# Patient Record
Sex: Female | Born: 1965 | Race: Black or African American | Hispanic: No | Marital: Single | Smoking: Current every day smoker
Health system: Southern US, Community
[De-identification: ages and names within clinical notes are randomized; demographics above are authoritative.]

---

## 2011-12-16 ENCOUNTER — Ambulatory Visit: Payer: Self-pay | Admitting: Family

## 2011-12-23 ENCOUNTER — Ambulatory Visit: Payer: Self-pay | Admitting: Family

## 2012-01-05 ENCOUNTER — Ambulatory Visit (INDEPENDENT_AMBULATORY_CARE_PROVIDER_SITE_OTHER): Payer: BC Managed Care – PPO | Admitting: Family

## 2012-01-05 ENCOUNTER — Encounter: Payer: Self-pay | Admitting: Family

## 2012-01-05 VITALS — BP 120/80 | HR 100 | Ht 65.5 in | Wt 184.0 lb

## 2012-01-05 DIAGNOSIS — E669 Obesity, unspecified: Secondary | ICD-10-CM

## 2012-01-05 DIAGNOSIS — Z23 Encounter for immunization: Secondary | ICD-10-CM

## 2012-01-05 MED ORDER — PHENTERMINE HCL 37.5 MG PO CAPS: 37.5000 mg | ORAL_CAPSULE | ORAL | Status: DC

## 2012-01-05 NOTE — Patient Instructions (Signed)

## 2012-01-05 NOTE — Progress Notes (Signed)
  Subjective:    Patient ID: Angelica Yoder, female    DOB: 1965/08/01, 46 y.o.   MRN: [id number]  HPI 46 year  Old black female, nonsmoker, is in today to be established. She is transferring from [facility] in [place]. She has concerns of obesity. She has not been exercising. She has been attempting to watch her diet. She has been on Phentermine in the past that has helped. She has had labs performed in June that were all negative.    Review of Systems  Constitutional: Positive for unexpected weight change. Negative for diaphoresis, appetite change and fatigue.  Respiratory: Negative.   Cardiovascular: Negative.   Gastrointestinal: Negative.   Musculoskeletal: Negative.   Skin: Negative.   Neurological: Negative.   Hematological: Negative.   Psychiatric/Behavioral: Negative.    No past medical history on file.  History   Social History  . Marital Status: Single    Spouse Name: N/A    Number of Children: N/A  . Years of Education: N/A   Occupational History  . Not on file.   Social History Main Topics  . Smoking status: Current Every Day Smoker  . Smokeless tobacco: Not on file  . Alcohol Use: Yes     wine occassionally  . Drug Use: No  . Sexually Active: Yes -- Female partner(s)   Other Topics Concern  . Not on file   Social History Narrative  . No narrative on file    No past surgical history on file.  Family History  Problem Relation Age of Onset  . Alcohol abuse Mother   . Hypertension Mother   . Hypertension Father   . Diabetes Paternal Grandmother     Allergies  Allergen Reactions  . Rocephin (Ceftriaxone)     Current Outpatient Prescriptions on File Prior to Visit  Medication Sig Dispense Refill  . phentermine 37.5 MG capsule Take 1 capsule (37.5 mg total) by mouth every morning.  30 capsule  1    BP 120/80  Pulse 100  Ht 5' 5.5" (1.664 m)  Wt 184 lb (83.462 kg)  BMI 30.15 kg/m2  SpO2 98%  LMP 09/30/2013chart      Objective:   Physical Exam  Constitutional: She is oriented to person, place, and time. She appears well-developed and well-nourished.  Neck: Normal range of motion. Neck supple. No thyromegaly present.  Cardiovascular: Normal rate, regular rhythm and normal heart sounds.   Pulmonary/Chest: Effort normal and breath sounds normal.  Abdominal: Soft. Bowel sounds are normal.  Musculoskeletal: Normal range of motion.  Neurological: She is alert and oriented to person, place, and time.  Skin: Skin is warm and dry.  Psychiatric: She has a normal mood and affect.          Assessment & Plan:  Assessment: Obesity, Weight Gain  Plan: Start Phentermine 37.5mg  once a day. Recheck 1 month and sooner as needed. Schedule mammogram. Request records from [facility].

## 2012-03-07 ENCOUNTER — Ambulatory Visit: Payer: BC Managed Care – PPO | Admitting: Family

## 2012-03-08 ENCOUNTER — Ambulatory Visit (INDEPENDENT_AMBULATORY_CARE_PROVIDER_SITE_OTHER): Payer: BC Managed Care – PPO | Admitting: Family

## 2012-03-08 ENCOUNTER — Encounter: Payer: Self-pay | Admitting: Family

## 2012-03-08 VITALS — BP 120/80 | Temp 98.2°F | Wt 183.0 lb

## 2012-03-08 DIAGNOSIS — Z720 Tobacco use: Secondary | ICD-10-CM

## 2012-03-08 DIAGNOSIS — F17200 Nicotine dependence, unspecified, uncomplicated: Secondary | ICD-10-CM

## 2012-03-08 DIAGNOSIS — E669 Obesity, unspecified: Secondary | ICD-10-CM

## 2012-03-08 MED ORDER — VARENICLINE TARTRATE 0.5 MG X 11 & 1 MG X 42 PO MISC: ORAL | Status: DC

## 2012-03-08 MED ORDER — PHENTERMINE HCL 37.5 MG PO CAPS: 37.5000 mg | ORAL_CAPSULE | ORAL | Status: DC

## 2012-03-08 NOTE — Progress Notes (Signed)
  Subjective:    Patient ID: Angelica Yoder, female    DOB: [birth year], 46 y.o.   MRN: [id number]  HPI 46 year old [place] American female, smoker is in today for recheck of obesity. 2 months ago she was in phentermine 37.5 mg once daily. She is down 1 pound today. However, reports to her closer feeling much better. She's not currently exercising. However she works at [employer]. Continues to drink sodas, but has increased her intake of juice and water. Patient has been under an increased amount of stress due to the death of her mother. She is also less sexually active. Currently smokes 4-5 cigarettes a day.   Review of Systems  Constitutional: Negative.   Respiratory: Negative.   Cardiovascular: Negative.   Gastrointestinal: Negative.   Musculoskeletal: Negative.   Skin: Negative.   Neurological: Negative.   Hematological: Negative.   Psychiatric/Behavioral: Negative.    No past medical history on file.  History   Social History  . Marital Status: Single    Spouse Name: N/A    Number of Children: N/A  . Years of Education: N/A   Occupational History  . Not on file.   Social History Main Topics  . Smoking status: Current Every Day Smoker  . Smokeless tobacco: Not on file  . Alcohol Use: Yes     Comment: wine occassionally  . Drug Use: No  . Sexually Active: Yes -- Female partner(s)   Other Topics Concern  . Not on file   Social History Narrative  . No narrative on file    No past surgical history on file.  Family History  Problem Relation Age of Onset  . Alcohol abuse Mother   . Hypertension Mother   . Hypertension Father   . Diabetes Paternal Grandmother     Allergies  Allergen Reactions  . Rocephin (Ceftriaxone)     Current Outpatient Prescriptions on File Prior to Visit  Medication Sig Dispense Refill  . [DISCONTINUED] phentermine 37.5 MG capsule Take 1 capsule (37.5 mg total) by mouth every morning.  30 capsule  1    BP 120/80  Temp 98.2 F (36.8 C)  (Oral)  Wt 183 lb (83.008 kg)chart    Objective:   Physical Exam  Constitutional: She is oriented to person, place, and time. She appears well-developed and well-nourished.  Neck: Normal range of motion. Neck supple. No thyromegaly present.  Cardiovascular: Normal rate, regular rhythm and normal heart sounds.   Pulmonary/Chest: Effort normal and breath sounds normal.  Abdominal: Soft. Bowel sounds are normal.  Musculoskeletal: Normal range of motion.  Neurological: She is alert and oriented to person, place, and time.  Skin: Skin is warm and dry.  Psychiatric: She has a normal mood and affect.          Assessment & Plan:  Assessment: Obesity, Tobacco Abuse  Plan: Refilled Phentermine x 1 month. If she does not lose weight, we will discontinue med. Exercise 30 min most days a week. Start Chantix daily. Call the office if symptoms worsen or persist. Recheck as scheduled and sooner as needed.

## 2012-03-08 NOTE — Patient Instructions (Signed)

## 2012-04-14 ENCOUNTER — Ambulatory Visit: Payer: BC Managed Care – PPO | Admitting: Family

## 2012-04-15 ENCOUNTER — Encounter: Payer: Self-pay | Admitting: Family

## 2012-04-15 ENCOUNTER — Other Ambulatory Visit (HOSPITAL_COMMUNITY)
Admission: RE | Admit: 2012-04-15 | Discharge: 2012-04-15 | Disposition: A | Discharge status: Home / Self Care | Payer: BC Managed Care – PPO | Source: Ambulatory Visit | Attending: Family | Admitting: Family

## 2012-04-15 ENCOUNTER — Ambulatory Visit (INDEPENDENT_AMBULATORY_CARE_PROVIDER_SITE_OTHER): Payer: BC Managed Care – PPO | Admitting: Family

## 2012-04-15 VITALS — BP 118/78 | HR 100 | Temp 98.2°F | Wt 179.0 lb

## 2012-04-15 DIAGNOSIS — J309 Allergic rhinitis, unspecified: Secondary | ICD-10-CM

## 2012-04-15 DIAGNOSIS — N898 Other specified noninflammatory disorders of vagina: Secondary | ICD-10-CM

## 2012-04-15 DIAGNOSIS — E669 Obesity, unspecified: Secondary | ICD-10-CM

## 2012-04-15 DIAGNOSIS — N760 Acute vaginitis: Secondary | ICD-10-CM | POA: Insufficient documentation

## 2012-04-15 DIAGNOSIS — Z79899 Other long term (current) drug therapy: Secondary | ICD-10-CM

## 2012-04-15 MED ORDER — FLUTICASONE PROPIONATE 50 MCG/ACT NA SUSP: 2.0000 | Freq: Every day | NASAL | Status: DC

## 2012-04-15 MED ORDER — DOXYCYCLINE HYCLATE 100 MG PO TABS: 100.0000 mg | ORAL_TABLET | Freq: Two times a day (BID) | ORAL | Status: DC

## 2012-04-15 MED ORDER — PHENTERMINE HCL 37.5 MG PO CAPS: 37.5000 mg | ORAL_CAPSULE | ORAL | Status: DC

## 2012-04-15 NOTE — Patient Instructions (Signed)
Allergic Rhinitis Allergic rhinitis is when the mucous membranes in the nose respond to allergens. Allergens are particles in the air that cause your body to have an allergic reaction. This causes you to release allergic antibodies. Through a chain of events, these eventually cause you to release histamine into the blood stream (hence the use of antihistamines). Although meant to be protective to the body, it is this release that causes your discomfort, such as frequent sneezing, congestion and an itchy runny nose.  CAUSES  The pollen allergens may come from grasses, trees, and weeds. This is seasonal allergic rhinitis, or "hay fever." Other allergens cause year-round allergic rhinitis (perennial allergic rhinitis) such as house dust mite allergen, pet dander and mold spores.  SYMPTOMS   Nasal stuffiness (congestion).  Runny, itchy nose with sneezing and tearing of the eyes.  There is often an itching of the mouth, eyes and ears. It cannot be cured, but it can be controlled with medications. DIAGNOSIS  If you are unable to determine the offending allergen, skin or blood testing may find it. TREATMENT   Avoid the allergen.  Medications and allergy shots (immunotherapy) can help.  Hay fever may often be treated with antihistamines in pill or nasal spray forms. Antihistamines block the effects of histamine. There are over-the-counter medicines that may help with nasal congestion and swelling around the eyes. Check with your caregiver before taking or giving this medicine. If the treatment above does not work, there are many new medications your caregiver can prescribe. Stronger medications may be used if initial measures are ineffective. Desensitizing injections can be used if medications and avoidance fails. Desensitization is when a patient is given ongoing shots until the body becomes less sensitive to the allergen. Make sure you follow up with your caregiver if problems continue. SEEK MEDICAL  CARE IF:   You develop fever (more than 100.5 F (38.1 C).  You develop a cough that does not stop easily (persistent).  You have shortness of breath.  You start wheezing.  Symptoms interfere with normal daily activities. Document Released: 12/16/2000 Document Revised: 06/15/2011 Document Reviewed: 06/27/2008 [facility] Patient Information 2013 [place], [place].   Smoking Cessation Quitting smoking is important to your health and has many advantages. However, it is not always easy to quit since nicotine is a very addictive drug. Often times, people try 3 times or more before being able to quit. This document explains the best ways for you to prepare to quit smoking. Quitting takes hard work and a lot of effort, but you can do it. ADVANTAGES OF QUITTING SMOKING  You will live longer, feel better, and live better.  Your body will feel the impact of quitting smoking almost immediately.  Within 20 minutes, blood pressure decreases. Your pulse returns to its normal level.  After 8 hours, carbon monoxide levels in the blood return to normal. Your oxygen level increases.  After 24 hours, the chance of having a heart attack starts to decrease. Your breath, hair, and body stop smelling like smoke.  After 48 hours, damaged nerve endings begin to recover. Your sense of taste and smell improve.  After 72 hours, the body is virtually free of nicotine. Your bronchial tubes relax and breathing becomes easier.  After 2 to 12 weeks, lungs can hold more air. Exercise becomes easier and circulation improves.  The risk of having a heart attack, stroke, cancer, or lung disease is greatly reduced.  After 1 year, the risk of coronary heart disease is cut in  half.  After 5 years, the risk of stroke falls to the same as a nonsmoker.  After 10 years, the risk of lung cancer is cut in half and the risk of other cancers decreases significantly.  After 15 years, the risk of coronary heart disease drops,  usually to the level of a nonsmoker.  If you are pregnant, quitting smoking will improve your chances of having a healthy baby.  The people you live with, especially any children, will be healthier.  You will have extra money to spend on things other than cigarettes. QUESTIONS TO THINK ABOUT BEFORE ATTEMPTING TO QUIT You may want to talk about your answers with your caregiver.  Why do you want to quit?  If you tried to quit in the past, what helped and what did not?  What will be the most difficult situations for you after you quit? How will you plan to handle them?  Who can help you through the tough times? Your family? Friends? A caregiver?  What pleasures do you get from smoking? What ways can you still get pleasure if you quit? Here are some questions to ask your caregiver:  How can you help me to be successful at quitting?  What medicine do you think would be best for me and how should I take it?  What should I do if I need more help?  What is smoking withdrawal like? How can I get information on withdrawal? GET READY  Set a quit date.  Change your environment by getting rid of all cigarettes, ashtrays, matches, and lighters in your home, car, or work. Do not let people smoke in your home.  Review your past attempts to quit. Think about what worked and what did not. GET SUPPORT AND ENCOURAGEMENT You have a better chance of being successful if you have help. You can get support in many ways.  Tell your family, friends, and co-workers that you are going to quit and need their support. Ask them not to smoke around you.  Get individual, group, or telephone counseling and support. Programs are available at [employer] and health centers. Call your local health department for information about programs in your area.  Spiritual beliefs and practices may help some smokers quit.  Download a "quit meter" on your computer to keep track of quit statistics, such as how long  you have gone without smoking, cigarettes not smoked, and money saved.  Get a self-help book about quitting smoking and staying off of tobacco. LEARN NEW SKILLS AND BEHAVIORS  Distract yourself from urges to smoke. Talk to someone, go for a walk, or occupy your time with a task.  Change your normal routine. Take a different route to work. Drink tea instead of coffee. Eat breakfast in a different place.  Reduce your stress. Take a hot bath, exercise, or read a book.  Plan something enjoyable to do every day. Reward yourself for not smoking.  Explore interactive web-based programs that specialize in helping you quit. GET MEDICINE AND USE IT CORRECTLY Medicines can help you stop smoking and decrease the urge to smoke. Combining medicine with the above behavioral methods and support can greatly increase your chances of successfully quitting smoking.  Nicotine replacement therapy helps deliver nicotine to your body without the negative effects and risks of smoking. Nicotine replacement therapy includes nicotine gum, lozenges, inhalers, nasal sprays, and skin patches. Some may be available over-the-counter and others require a prescription.  Antidepressant medicine helps people abstain from smoking, but  how this works is unknown. This medicine is available by prescription.  Nicotinic receptor partial agonist medicine simulates the effect of nicotine in your brain. This medicine is available by prescription. Ask your caregiver for advice about which medicines to use and how to use them based on your health history. Your caregiver will tell you what side effects to look out for if you choose to be on a medicine or therapy. Carefully read the information on the package. Do not use any other product containing nicotine while using a nicotine replacement product.  RELAPSE OR DIFFICULT SITUATIONS Most relapses occur within the first 3 months after quitting. Do not be discouraged if you start smoking  again. Remember, most people try several times before finally quitting. You may have symptoms of withdrawal because your body is used to nicotine. You may crave cigarettes, be irritable, feel very hungry, cough often, get headaches, or have difficulty concentrating. The withdrawal symptoms are only temporary. They are strongest when you first quit, but they will go away within 10 14 days. To reduce the chances of relapse, try to:  Avoid drinking alcohol. Drinking lowers your chances of successfully quitting.  Reduce the amount of caffeine you consume. Once you quit smoking, the amount of caffeine in your body increases and can give you symptoms, such as a rapid heartbeat, sweating, and anxiety.  Avoid smokers because they can make you want to smoke.  Do not let weight gain distract you. Many smokers will gain weight when they quit, usually less than 10 pounds. Eat a healthy diet and stay active. You can always lose the weight gained after you quit.  Find ways to improve your mood other than smoking. FOR MORE INFORMATION  www.smokefree.gov  Document Released: 03/17/2001 Document Revised: 09/22/2011 Document Reviewed: 07/02/2011 [facility] Patient Information 2013 [place], [place].

## 2012-04-15 NOTE — Progress Notes (Signed)
Subjective:    Patient ID: Angelica Yoder, female    DOB: [birth year], 47 y.o.   MRN: [id number]  HPI 47 year old African American female, one pack per day smoker is in for recheck of obesity. She's currently taking phentermine 37.5 mg once daily. She is now down 5 pounds from her last office visit. She's tolerating the medication well. Denies any concerns.  Patient has concerns of vaginal odor x1 week. She sexually active with one female partner, unprotected. Denies any bleeding with intercourse, no, pain or back pain. No concerns any sexually transmitted diseases. Last menstrual period 2-3 weeks ago  Patient has concerns of ear pain, sore throat, congestion x2 days. She has not taken anything over-the-counter. Denies any appetite changes, no fever.   Review of Systems  Constitutional: Negative.   HENT: Positive for congestion and sneezing.   Respiratory: Negative.   Cardiovascular: Negative.   Gastrointestinal: Negative.  Negative for abdominal pain.  Genitourinary: Positive for vaginal discharge.  Musculoskeletal: Negative.  Negative for back pain.  Skin: Negative.   Neurological: Negative.   Hematological: Negative.   Psychiatric/Behavioral: Negative.    No past medical history on file.  History   Social History  . Marital Status: Single    Spouse Name: N/A    Number of Children: N/A  . Years of Education: N/A   Occupational History  . Not on file.   Social History Main Topics  . Smoking status: Current Every Day Smoker  . Smokeless tobacco: Not on file  . Alcohol Use: Yes     Comment: wine occassionally  . Drug Use: No  . Sexually Active: Yes -- Female partner(s)   Other Topics Concern  . Not on file   Social History Narrative  . No narrative on file    No past surgical history on file.  Family History  Problem Relation Age of Onset  . Alcohol abuse Mother   . Hypertension Mother   . Hypertension Father   . Diabetes Paternal Grandmother     Allergies    Allergen Reactions  . Rocephin (Ceftriaxone)     Current Outpatient Prescriptions on File Prior to Visit  Medication Sig Dispense Refill  . phentermine 37.5 MG capsule Take 1 capsule (37.5 mg total) by mouth every morning.  30 capsule  0  . fluticasone (FLONASE) 50 MCG/ACT nasal spray Place 2 sprays into the nose daily.  16 g  6  . varenicline (CHANTIX STARTING MONTH PAK) 0.5 MG X 11 & 1 MG X 42 tablet Take one 0.5 mg tablet by mouth once daily for 3 days, then increase to one 0.5 mg tablet twice daily for 4 days, then increase to one 1 mg tablet twice daily.  53 tablet  2    BP 118/78  Pulse 100  Temp 98.2 F (36.8 C) (Oral)  Wt 179 lb (81.194 kg)  SpO2 94%chart    Objective:   Physical Exam  Constitutional: She is oriented to person, place, and time. She appears well-developed and well-nourished.  HENT:  Right Ear: External ear normal.  Left Ear: External ear normal.  Mouth/Throat: Oropharynx is clear and moist.       Nasal turbinates red and swollen bilaterally  Neck: Normal range of motion. Neck supple.  Cardiovascular: Normal rate, regular rhythm and normal heart sounds.   Pulmonary/Chest: Effort normal and breath sounds normal.  Genitourinary: Vaginal discharge found.       Removed tampon. Very malodorous discharge.   Musculoskeletal: Normal range of  motion.  Neurological: She is alert and oriented to person, place, and time.  Skin: Skin is warm and dry.  Psychiatric: She has a normal mood and affect.          Assessment & Plan:  Assessment: Removal of foreign body, Obesity, Vaginal discharge, and Allergic rhinitis  Plan: We'll treat patient for possible pelvic inflammatory disease with doxycycline 100 mg twice a day x10 days. Flonase 2 sprays in each nostril once a day for allergic rhinitis. Refill phentermine 37.5 mg once daily for obesity. Bring patient back for recheck in one month and as needed sooner. Wet prep sent.

## 2012-04-22 ENCOUNTER — Other Ambulatory Visit: Payer: Self-pay | Admitting: Family

## 2012-04-22 MED ORDER — METRONIDAZOLE 500 MG PO TABS: 500.0000 mg | ORAL_TABLET | Freq: Two times a day (BID) | ORAL | Status: DC

## 2013-02-20 ENCOUNTER — Emergency Department (HOSPITAL_COMMUNITY): Payer: BC Managed Care – PPO

## 2013-02-20 ENCOUNTER — Emergency Department (HOSPITAL_COMMUNITY)
Admission: EM | Admit: 2013-02-20 | Discharge: 2013-02-20 | Disposition: A | Discharge status: Home / Self Care | Payer: BC Managed Care – PPO | Attending: Emergency Medicine | Admitting: Emergency Medicine

## 2013-02-20 ENCOUNTER — Encounter (HOSPITAL_COMMUNITY): Payer: Self-pay | Admitting: Emergency Medicine

## 2013-02-20 DIAGNOSIS — R51 Headache: Secondary | ICD-10-CM | POA: Insufficient documentation

## 2013-02-20 DIAGNOSIS — S161XXA Strain of muscle, fascia and tendon at neck level, initial encounter: Secondary | ICD-10-CM

## 2013-02-20 DIAGNOSIS — Y939 Activity, unspecified: Secondary | ICD-10-CM | POA: Insufficient documentation

## 2013-02-20 DIAGNOSIS — F17200 Nicotine dependence, unspecified, uncomplicated: Secondary | ICD-10-CM | POA: Insufficient documentation

## 2013-02-20 DIAGNOSIS — Y92410 Unspecified street and highway as the place of occurrence of the external cause: Secondary | ICD-10-CM | POA: Insufficient documentation

## 2013-02-20 DIAGNOSIS — Z881 Allergy status to other antibiotic agents status: Secondary | ICD-10-CM | POA: Insufficient documentation

## 2013-02-20 DIAGNOSIS — S139XXA Sprain of joints and ligaments of unspecified parts of neck, initial encounter: Secondary | ICD-10-CM | POA: Insufficient documentation

## 2013-02-20 MED ORDER — HYDROCODONE-ACETAMINOPHEN 5-325 MG PO TABS
2.0000 | ORAL_TABLET | Freq: Once | ORAL | Status: AC
  Administered 2013-02-20: 2 via ORAL
  Filled 2013-02-20: qty 2

## 2013-02-20 MED ORDER — HYDROCODONE-ACETAMINOPHEN 5-325 MG PO TABS: ORAL_TABLET | ORAL | Status: DC
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
## 2013-02-20 NOTE — ED Notes (Signed)
Patient transported to X-ray 
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
## 2013-02-22 ENCOUNTER — Other Ambulatory Visit: Payer: Self-pay | Admitting: Family

## 2013-02-22 MED ORDER — METRONIDAZOLE 500 MG PO TABS: 500.0000 mg | ORAL_TABLET | Freq: Two times a day (BID) | ORAL | Status: DC

## 2013-03-01 NOTE — ED Provider Notes (Signed)
Medical screening examination/treatment/procedure(s) were performed by non-physician practitioner and as supervising physician I was immediately available for consultation/collaboration.  EKG Interpretation   None        Gracin Mcpartland K Linker, MD 03/01/13 1508 

## 2013-03-10 ENCOUNTER — Emergency Department (HOSPITAL_COMMUNITY)
Admission: EM | Admit: 2013-03-10 | Discharge: 2013-03-10 | Disposition: A | Discharge status: Home / Self Care | Payer: BC Managed Care – PPO | Attending: Emergency Medicine | Admitting: Emergency Medicine

## 2013-03-10 ENCOUNTER — Emergency Department (HOSPITAL_COMMUNITY): Payer: BC Managed Care – PPO

## 2013-03-10 ENCOUNTER — Encounter (HOSPITAL_COMMUNITY): Payer: Self-pay | Admitting: Emergency Medicine

## 2013-03-10 DIAGNOSIS — Y9389 Activity, other specified: Secondary | ICD-10-CM | POA: Insufficient documentation

## 2013-03-10 DIAGNOSIS — S59909A Unspecified injury of unspecified elbow, initial encounter: Secondary | ICD-10-CM | POA: Insufficient documentation

## 2013-03-10 DIAGNOSIS — Y92410 Unspecified street and highway as the place of occurrence of the external cause: Secondary | ICD-10-CM | POA: Insufficient documentation

## 2013-03-10 DIAGNOSIS — S6990XA Unspecified injury of unspecified wrist, hand and finger(s), initial encounter: Secondary | ICD-10-CM | POA: Insufficient documentation

## 2013-03-10 DIAGNOSIS — F17200 Nicotine dependence, unspecified, uncomplicated: Secondary | ICD-10-CM | POA: Insufficient documentation

## 2013-03-10 DIAGNOSIS — M62838 Other muscle spasm: Secondary | ICD-10-CM

## 2013-03-10 DIAGNOSIS — M25531 Pain in right wrist: Secondary | ICD-10-CM

## 2013-03-10 MED ORDER — DIAZEPAM 5 MG PO TABS
5.0000 mg | ORAL_TABLET | Freq: Once | ORAL | Status: AC
  Administered 2013-03-10: 5 mg via ORAL
  Filled 2013-03-10: qty 1

## 2013-03-10 MED ORDER — DIAZEPAM 5 MG PO TABS: 5.0000 mg | ORAL_TABLET | Freq: Two times a day (BID) | ORAL | Status: DC | PRN

## 2013-03-10 MED ORDER — IBUPROFEN 800 MG PO TABS: 800.0000 mg | ORAL_TABLET | Freq: Three times a day (TID) | ORAL | Status: DC
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
## 2013-03-10 NOTE — ED Provider Notes (Signed)
Medical screening examination/treatment/procedure(s) were performed by non-physician practitioner and as supervising physician I was immediately available for consultation/collaboration.  EKG Interpretation   None         Johna Kearl N Kaymon Denomme, DO 03/10/13 2356 
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
## 2013-03-14 ENCOUNTER — Encounter: Payer: Self-pay | Admitting: Family

## 2013-03-14 ENCOUNTER — Ambulatory Visit (INDEPENDENT_AMBULATORY_CARE_PROVIDER_SITE_OTHER): Payer: BC Managed Care – PPO | Admitting: Family

## 2013-03-14 VITALS — BP 120/80 | HR 81 | Wt 189.0 lb

## 2013-03-14 DIAGNOSIS — M545 Low back pain: Secondary | ICD-10-CM

## 2013-03-14 DIAGNOSIS — M25539 Pain in unspecified wrist: Secondary | ICD-10-CM

## 2013-03-14 DIAGNOSIS — M25531 Pain in right wrist: Secondary | ICD-10-CM
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
## 2013-03-14 NOTE — Progress Notes (Signed)
Pre visit review using our clinic review tool, if applicable. No additional management support is needed unless otherwise documented below in the visit note. 

## 2013-03-22 ENCOUNTER — Encounter: Payer: Self-pay | Admitting: Family

## 2013-03-22 ENCOUNTER — Ambulatory Visit (INDEPENDENT_AMBULATORY_CARE_PROVIDER_SITE_OTHER)
Admission: RE | Admit: 2013-03-22 | Discharge: 2013-03-22 | Disposition: A | Discharge status: Home / Self Care | Payer: BC Managed Care – PPO | Source: Ambulatory Visit | Attending: Family | Admitting: Family

## 2013-03-22 ENCOUNTER — Ambulatory Visit (INDEPENDENT_AMBULATORY_CARE_PROVIDER_SITE_OTHER): Payer: BC Managed Care – PPO | Admitting: Family

## 2013-03-22 VITALS — BP 104/80 | HR 87 | Wt 188.0 lb

## 2013-03-22 DIAGNOSIS — M542 Cervicalgia: Secondary | ICD-10-CM

## 2013-03-22 DIAGNOSIS — S161XXD Strain of muscle, fascia and tendon at neck level, subsequent encounter: Secondary | ICD-10-CM

## 2013-03-22 DIAGNOSIS — M545 Low back pain: Secondary | ICD-10-CM

## 2013-03-22 DIAGNOSIS — Z5189 Encounter for other specified aftercare: Secondary | ICD-10-CM

## 2013-03-22 MED ORDER — CYCLOBENZAPRINE HCL 10 MG PO TABS: 10.0000 mg | ORAL_TABLET | Freq: Three times a day (TID) | ORAL | Status: DC | PRN

## 2013-03-22 NOTE — Progress Notes (Signed)
Pre visit review using our clinic review tool, if applicable. No additional management support is needed unless otherwise documented below in the visit note. 

## 2013-03-22 NOTE — Patient Instructions (Signed)
Muscle Strain  Muscle strain occurs when a muscle is stretched beyond its normal length. A small number of muscle fibers generally are torn. This is especially common in athletes. This happens when a sudden, violent force placed on a muscle stretches it too far. Usually, recovery from muscle strain takes 1 to 2 weeks. Complete healing will take 5 to 6 weeks.   HOME CARE INSTRUCTIONS    While awake, apply ice to the sore muscle for the first 2 days after the injury.   Put ice in a plastic bag.   Place a towel between your skin and the bag.   Leave the ice on for 15-20 minutes each hour.   Do not use the strained muscle for several days, until you no longer have pain.   You may wrap the injured area with an elastic bandage for comfort. Be careful not to wrap it too tightly. This may interfere with blood circulation or increase swelling.   Only take over-the-counter or prescription medicines for pain, discomfort, or fever as directed by your caregiver.  SEEK MEDICAL CARE IF:   You have increasing pain or swelling in the injured area.  MAKE SURE YOU:    Understand these instructions.   Will watch your condition.   Will get help right away if you are not doing well or get worse.  Document Released: 03/23/2005 Document Revised: 06/15/2011 Document Reviewed: 04/04/2011  [employer] Patient Information 2014 [employer].
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
## 2013-03-28 ENCOUNTER — Ambulatory Visit: Payer: BC Managed Care – PPO

## 2013-04-10 ENCOUNTER — Encounter: Payer: Self-pay | Admitting: Family

## 2013-04-10 ENCOUNTER — Ambulatory Visit: Payer: BC Managed Care – PPO | Attending: Family | Admitting: Physical Therapy

## 2013-04-10 ENCOUNTER — Ambulatory Visit (INDEPENDENT_AMBULATORY_CARE_PROVIDER_SITE_OTHER): Payer: BC Managed Care – PPO | Admitting: Family

## 2013-04-10 VITALS — BP 118/68 | HR 79 | Wt 191.0 lb

## 2013-04-10 DIAGNOSIS — M542 Cervicalgia: Secondary | ICD-10-CM | POA: Insufficient documentation

## 2013-04-10 DIAGNOSIS — M545 Low back pain, unspecified: Secondary | ICD-10-CM | POA: Insufficient documentation

## 2013-04-10 DIAGNOSIS — M2560 Stiffness of unspecified joint, not elsewhere classified: Secondary | ICD-10-CM | POA: Insufficient documentation

## 2013-04-10 DIAGNOSIS — R5381 Other malaise: Secondary | ICD-10-CM | POA: Insufficient documentation

## 2013-04-10 DIAGNOSIS — IMO0001 Reserved for inherently not codable concepts without codable children: Secondary | ICD-10-CM | POA: Insufficient documentation

## 2013-04-10 NOTE — Progress Notes (Signed)
Pre visit review using our clinic review tool, if applicable. No additional management support is needed unless otherwise documented below in the visit note. 

## 2013-04-10 NOTE — Patient Instructions (Signed)
Back Exercises These exercises may help you when beginning to rehabilitate your injury. Your symptoms may resolve with or without further involvement from your physician, physical therapist or athletic trainer. While completing these exercises, remember:   Restoring tissue flexibility helps normal motion to return to the joints. This allows healthier, less painful movement and activity.  An effective stretch should be held for at least 30 seconds.  A stretch should never be painful. You should only feel a gentle lengthening or release in the stretched tissue. STRETCH  Extension, Prone on Elbows   Lie on your stomach on the floor, a bed will be too soft. Place your palms about shoulder width apart and at the height of your head.  Place your elbows under your shoulders. If this is too painful, stack pillows under your chest.  Allow your body to relax so that your hips drop lower and make contact more completely with the floor.  Hold this position for __________ seconds.  Slowly return to lying flat on the floor. Repeat __________ times. Complete this exercise __________ times per day.  RANGE OF MOTION  Extension, Prone Press Ups   Lie on your stomach on the floor, a bed will be too soft. Place your palms about shoulder width apart and at the height of your head.  Keeping your back as relaxed as possible, slowly straighten your elbows while keeping your hips on the floor. You may adjust the placement of your hands to maximize your comfort. As you gain motion, your hands will come more underneath your shoulders.  Hold this position __________ seconds.  Slowly return to lying flat on the floor. Repeat __________ times. Complete this exercise __________ times per day.  RANGE OF MOTION- Quadruped, Neutral Spine   Assume a hands and knees position on a firm surface. Keep your hands under your shoulders and your knees under your hips. You may place padding under your knees for comfort.  Drop  your head and point your tail bone toward the ground below you. This will round out your low back like an angry cat. Hold this position for __________ seconds.  Slowly lift your head and release your tail bone so that your back sags into a large arch, like an old horse.  Hold this position for __________ seconds.  Repeat this until you feel limber in your low back.  Now, find your "sweet spot." This will be the most comfortable position somewhere between the two previous positions. This is your neutral spine. Once you have found this position, tense your stomach muscles to support your low back.  Hold this position for __________ seconds. Repeat __________ times. Complete this exercise __________ times per day.  STRETCH  Flexion, Single Knee to Chest   Lie on a firm bed or floor with both legs extended in front of you.  Keeping one leg in contact with the floor, bring your opposite knee to your chest. Hold your leg in place by either grabbing behind your thigh or at your knee.  Pull until you feel a gentle stretch in your low back. Hold __________ seconds.  Slowly release your grasp and repeat the exercise with the opposite side. Repeat __________ times. Complete this exercise __________ times per day.  STRETCH - Hamstrings, Standing  Stand or sit and extend your right / left leg, placing your foot on a chair or foot stool  Keeping a slight arch in your low back and your hips straight forward.  Lead with your chest and   lean forward at the waist until you feel a gentle stretch in the back of your right / left knee or thigh. (When done correctly, this exercise requires leaning only a small distance.)  Hold this position for __________ seconds. Repeat __________ times. Complete this stretch __________ times per day. STRENGTHENING  Deep Abdominals, Pelvic Tilt   Lie on a firm bed or floor. Keeping your legs in front of you, bend your knees so they are both pointed toward the ceiling and  your feet are flat on the floor.  Tense your lower abdominal muscles to press your low back into the floor. This motion will rotate your pelvis so that your tail bone is scooping upwards rather than pointing at your feet or into the floor.  With a gentle tension and even breathing, hold this position for __________ seconds. Repeat __________ times. Complete this exercise __________ times per day.  STRENGTHENING  Abdominals, Crunches   Lie on a firm bed or floor. Keeping your legs in front of you, bend your knees so they are both pointed toward the ceiling and your feet are flat on the floor. Cross your arms over your chest.  Slightly tip your chin down without bending your neck.  Tense your abdominals and slowly lift your trunk high enough to just clear your shoulder blades. Lifting higher can put excessive stress on the low back and does not further strengthen your abdominal muscles.  Control your return to the starting position. Repeat __________ times. Complete this exercise __________ times per day.  STRENGTHENING  Quadruped, Opposite UE/LE Lift   Assume a hands and knees position on a firm surface. Keep your hands under your shoulders and your knees under your hips. You may place padding under your knees for comfort.  Find your neutral spine and gently tense your abdominal muscles so that you can maintain this position. Your shoulders and hips should form a rectangle that is parallel with the floor and is not twisted.  Keeping your trunk steady, lift your right hand no higher than your shoulder and then your left leg no higher than your hip. Make sure you are not holding your breath. Hold this position __________ seconds.  Continuing to keep your abdominal muscles tense and your back steady, slowly return to your starting position. Repeat with the opposite arm and leg. Repeat __________ times. Complete this exercise __________ times per day. Document Released: 04/10/2005 Document  Revised: 06/15/2011 Document Reviewed: 07/05/2008 [employer] Patient Information 2014 [employer].  
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
## 2013-04-12 ENCOUNTER — Ambulatory Visit: Payer: BC Managed Care – PPO | Admitting: Physical Therapy

## 2013-04-13 ENCOUNTER — Ambulatory Visit: Payer: BC Managed Care – PPO | Admitting: Physical Therapy

## 2013-04-14 ENCOUNTER — Ambulatory Visit: Payer: BC Managed Care – PPO | Admitting: Physical Therapy

## 2013-04-18 ENCOUNTER — Ambulatory Visit: Payer: BC Managed Care – PPO | Admitting: Physical Therapy

## 2013-04-19 ENCOUNTER — Ambulatory Visit: Payer: BC Managed Care – PPO | Admitting: Physical Therapy

## 2013-04-19 ENCOUNTER — Ambulatory Visit: Payer: BC Managed Care – PPO | Admitting: Family

## 2013-04-21 ENCOUNTER — Encounter: Payer: Self-pay | Admitting: Family

## 2013-04-21 ENCOUNTER — Other Ambulatory Visit (HOSPITAL_COMMUNITY)
Admission: RE | Admit: 2013-04-21 | Discharge: 2013-04-21 | Disposition: A | Discharge status: Home / Self Care | Payer: BC Managed Care – PPO | Source: Ambulatory Visit | Attending: Family | Admitting: Family

## 2013-04-21 ENCOUNTER — Encounter: Payer: BC Managed Care – PPO | Admitting: Physical Therapy

## 2013-04-21 ENCOUNTER — Ambulatory Visit (INDEPENDENT_AMBULATORY_CARE_PROVIDER_SITE_OTHER): Payer: BC Managed Care – PPO | Admitting: Family

## 2013-04-21 VITALS — BP 112/68 | HR 81 | Wt 191.0 lb

## 2013-04-21 DIAGNOSIS — B9689 Other specified bacterial agents as the cause of diseases classified elsewhere: Secondary | ICD-10-CM

## 2013-04-21 DIAGNOSIS — N760 Acute vaginitis: Secondary | ICD-10-CM

## 2013-04-21 DIAGNOSIS — A499 Bacterial infection, unspecified: Secondary | ICD-10-CM

## 2013-04-21 DIAGNOSIS — M545 Low back pain, unspecified: Secondary | ICD-10-CM

## 2013-04-21 DIAGNOSIS — Z113 Encounter for screening for infections with a predominantly sexual mode of transmission: Secondary | ICD-10-CM

## 2013-04-21 DIAGNOSIS — N898 Other specified noninflammatory disorders of vagina: Secondary | ICD-10-CM

## 2013-04-21 MED ORDER — METRONIDAZOLE 0.75 % VA GEL: 1.0000 | Freq: Every day | VAGINAL | Status: AC

## 2013-04-21 MED ORDER — HYDROCODONE-ACETAMINOPHEN 10-325 MG PO TABS: 0.5000 | ORAL_TABLET | Freq: Three times a day (TID) | ORAL | Status: DC | PRN

## 2013-04-21 NOTE — Patient Instructions (Signed)

## 2013-04-22 LAB — HIV ANTIBODY (ROUTINE TESTING W REFLEX): HIV: NONREACTIVE

## 2013-04-22 LAB — RPR

## 2013-04-24 ENCOUNTER — Encounter: Payer: BC Managed Care – PPO | Admitting: Physical Therapy

## 2013-04-24 LAB — HSV 2 ANTIBODY, IGG: HSV 2 Glycoprotein G Ab, IgG: 7.68 IV — ABNORMAL HIGH

## 2013-04-24 NOTE — Progress Notes (Signed)
   Subjective:    Patient ID: Angelica Yoder, female    DOB: 1965/05/25, 48 y.o.   MRN: [id number]  HPI 47 year AAF, is in today with complaints of vaginal discharge and odor. She reports having unprotected sex on 04/06/2013 Angelica Yoder the same for all sexual transmitted diseases. Reports the satiety and sometimes musty vaginal discharge. Denies any bleeding with intercourse.   Review of Systems  Constitutional: Negative.   Respiratory: Negative.   Cardiovascular: Negative.   Genitourinary: Positive for vaginal discharge. Negative for vaginal bleeding and vaginal pain.  Skin: Negative.   Allergic/Immunologic: Negative.   Neurological: Negative.   Psychiatric/Behavioral: Negative.    History reviewed. No pertinent past medical history.  History   Social History  . Marital Status: Single    Spouse Name: N/A    Number of Children: N/A  . Years of Education: N/A   Occupational History  . Not on file.   Social History Main Topics  . Smoking status: Current Every Day Smoker  . Smokeless tobacco: Not on file  . Alcohol Use: Yes     Comment: wine occassionally  . Drug Use: No  . Sexual Activity: Yes    Partners: Male   Other Topics Concern  . Not on file   Social History Narrative  . No narrative on file    History reviewed. No pertinent past surgical history.  Family History  Problem Relation Age of Onset  . Alcohol abuse Mother   . Hypertension Mother   . Hypertension Father   . Diabetes Paternal Grandmother     Allergies  Allergen Reactions  . Rocephin [Ceftriaxone] Hives    Current Outpatient Prescriptions on File Prior to Visit  Medication Sig Dispense Refill  . Multiple Vitamin (MULTIVITAMIN WITH MINERALS) TABS tablet Take 1 tablet by mouth daily.      . vitamin B-12 (CYANOCOBALAMIN) 500 MCG tablet Take 1,000 mcg by mouth daily.       No current facility-administered medications on file prior to visit.    BP 112/68  Pulse 81  Wt 191 lb (86.637 kg)   LMP 12/10/2014chart    Objective:   Physical Exam  Constitutional: She is oriented to person, place, and time. She appears well-developed and well-nourished.  Neck: Normal range of motion. Neck supple.  Cardiovascular: Normal rate and regular rhythm.   Pulmonary/Chest: Effort normal and breath sounds normal.  Genitourinary: Guaiac negative stool. Vaginal discharge found.  Frothy vaginal discharge  Neurological: She is alert and oriented to person, place, and time.  Skin: Skin is warm and dry.  Psychiatric: She has a normal mood and affect.          Assessment & Plan:  Assessment: 1. Vaginal discharge 2. Bacterial vaginosis  Plan: Sexually transmitted disease panel. Will notify patient and the results. We'll treat with MetroGel vaginal suppository at bedtime x5 nights. Call the office if symptoms worsen or persist. Recheck a schedule, and as needed. Safe sex practices.

## 2013-04-25 ENCOUNTER — Other Ambulatory Visit: Payer: Self-pay | Admitting: Family

## 2013-04-25 DIAGNOSIS — A6000 Herpesviral infection of urogenital system, unspecified: Secondary | ICD-10-CM | POA: Insufficient documentation

## 2013-04-25 MED ORDER — VALACYCLOVIR HCL 500 MG PO TABS: 500.0000 mg | ORAL_TABLET | Freq: Every day | ORAL | Status: DC

## 2013-04-26 ENCOUNTER — Encounter: Payer: BC Managed Care – PPO | Admitting: Physical Therapy

## 2013-04-28 ENCOUNTER — Encounter: Payer: BC Managed Care – PPO | Admitting: Physical Therapy

## 2013-05-01 ENCOUNTER — Encounter: Payer: BC Managed Care – PPO | Admitting: Physical Therapy

## 2013-05-03 ENCOUNTER — Encounter: Payer: BC Managed Care – PPO | Admitting: Physical Therapy

## 2013-05-05 ENCOUNTER — Encounter: Payer: BC Managed Care – PPO | Admitting: Physical Therapy

## 2013-07-06 ENCOUNTER — Telehealth: Payer: Self-pay | Admitting: Family

## 2013-07-06 MED ORDER — HYDROCODONE-ACETAMINOPHEN 10-325 MG PO TABS: 0.5000 | ORAL_TABLET | Freq: Three times a day (TID) | ORAL | Status: DC | PRN

## 2013-07-06 NOTE — Telephone Encounter (Signed)
Pt requesting refill of hydrocodone. Advised pt that Oran Reinadonda is out of the office until Monday.  Spoke with Oran ReinPadonda and she has ok'd 30 tabs but wants to advise pt that if this becomes a medication that she wants filled regularly, she will have to see pain management. Pt also advised not to call Padonda's cell phone for medical related questions

## 2013-07-06 NOTE — Addendum Note (Signed)
Addended by: Beverely LowFRAZIER, Moriah Loughry L on: 07/06/2013 02:02 PM   Modules accepted: Orders

## 2013-07-06 NOTE — Telephone Encounter (Signed)
Pt would like ms campbell to call her asap. Pt states only that this is about her visit and her. Refused to elaborate.

## 2013-09-05 ENCOUNTER — Emergency Department (HOSPITAL_COMMUNITY)
Admission: EM | Admit: 2013-09-05 | Discharge: 2013-09-06 | Disposition: A | Discharge status: Home / Self Care | Payer: BC Managed Care – PPO | Attending: Emergency Medicine | Admitting: Emergency Medicine

## 2013-09-05 ENCOUNTER — Encounter (HOSPITAL_COMMUNITY): Payer: Self-pay | Admitting: Emergency Medicine

## 2013-09-05 DIAGNOSIS — K0889 Other specified disorders of teeth and supporting structures: Secondary | ICD-10-CM

## 2013-09-05 DIAGNOSIS — Z888 Allergy status to other drugs, medicaments and biological substances status: Secondary | ICD-10-CM | POA: Insufficient documentation

## 2013-09-05 DIAGNOSIS — K029 Dental caries, unspecified: Secondary | ICD-10-CM | POA: Insufficient documentation

## 2013-09-05 DIAGNOSIS — R609 Edema, unspecified: Secondary | ICD-10-CM | POA: Insufficient documentation

## 2013-09-05 DIAGNOSIS — K089 Disorder of teeth and supporting structures, unspecified: Secondary | ICD-10-CM | POA: Insufficient documentation

## 2013-09-05 DIAGNOSIS — F17200 Nicotine dependence, unspecified, uncomplicated: Secondary | ICD-10-CM | POA: Insufficient documentation

## 2013-09-05 NOTE — ED Notes (Signed)
Pt requesting pain medication.  

## 2013-09-05 NOTE — ED Notes (Signed)
Presents with dental pain to left lower jaw, began this AM took ibuprofen with no relief.

## 2013-09-06 MED ORDER — PENICILLIN V POTASSIUM 500 MG PO TABS: 500.0000 mg | ORAL_TABLET | Freq: Three times a day (TID) | ORAL | Status: DC

## 2013-09-06 MED ORDER — HYDROCODONE-ACETAMINOPHEN 5-325 MG PO TABS: ORAL_TABLET | ORAL | Status: DC

## 2013-09-06 MED ORDER — HYDROCODONE-ACETAMINOPHEN 5-325 MG PO TABS
1.0000 | ORAL_TABLET | Freq: Once | ORAL | Status: AC
  Administered 2013-09-06: 1 via ORAL
  Filled 2013-09-06: qty 1

## 2013-09-06 MED ORDER — PENICILLIN V POTASSIUM 250 MG PO TABS
500.0000 mg | ORAL_TABLET | Freq: Once | ORAL | Status: AC
  Administered 2013-09-06: 500 mg via ORAL
  Filled 2013-09-06: qty 2

## 2013-09-06 MED ORDER — NAPROXEN 500 MG PO TABS: 500.0000 mg | ORAL_TABLET | Freq: Two times a day (BID) | ORAL | Status: DC

## 2013-09-06 NOTE — ED Provider Notes (Signed)
CSN: [id number]     Arrival date & time 09/05/13  2236 History   First MD Initiated Contact with Patient 09/05/13 2304     Chief Complaint  Patient presents with  . Dental Pain     (Consider location/radiation/quality/duration/timing/severity/associated sxs/prior Treatment) HPI Comments: Patient presents with complaint of left lower jaw pain in the area of her last molar which began this morning. Patient denies neck swelling or trouble breathing or swallowing. Patient reports some left lower jaw swelling. She's taken ibuprofen without relief. No fever. Onset of symptoms gradual. Course is constant. Nothing makes symptoms better or worse.  Patient is a 48 y.o. female presenting with tooth pain. The history is provided by the patient.  Dental Pain Associated symptoms: facial swelling   Associated symptoms: no fever, no headaches and no neck pain     History reviewed. No pertinent past medical history. History reviewed. No pertinent past surgical history. Family History  Problem Relation Age of Onset  . Alcohol abuse Mother   . Hypertension Mother   . Hypertension Father   . Diabetes Paternal Grandmother    History  Substance Use Topics  . Smoking status: Current Every Day Smoker  . Smokeless tobacco: Not on file  . Alcohol Use: Yes     Comment: wine occassionally   OB History   Grav Para Term Preterm Abortions TAB SAB Ect Mult Living                 Review of Systems  Constitutional: Negative for fever.  HENT: Positive for dental problem and facial swelling. Negative for ear pain, sore throat and trouble swallowing.   Respiratory: Negative for shortness of breath and stridor.   Musculoskeletal: Negative for neck pain.  Skin: Negative for color change.  Neurological: Negative for headaches.      Allergies  Rocephin  Home Medications   Prior to Admission medications   Medication Sig Start Date End Date Taking? Authorizing Provider  HYDROcodone-acetaminophen  (NORCO/VICODIN) 5-325 MG per tablet Take 1-2 tablets every 6 hours as needed for severe pain 09/06/13   Renne CriglerJoshua Hanni Milford, PA-C  Multiple Vitamin (MULTIVITAMIN WITH MINERALS) TABS tablet Take 1 tablet by mouth daily.    Historical Provider, MD  naproxen (NAPROSYN) 500 MG tablet Take 1 tablet (500 mg total) by mouth 2 (two) times daily. 09/06/13   Renne CriglerJoshua Yoko Mcgahee, PA-C  penicillin v potassium (VEETID) 500 MG tablet Take 1 tablet (500 mg total) by mouth 3 (three) times daily. 09/06/13   Renne CriglerJoshua Eveny Anastas, PA-C  valACYclovir (VALTREX) 500 MG tablet Take 1 tablet (500 mg total) by mouth daily. 04/25/13   Baker PieriniPadonda B Campbell, FNP  vitamin B-12 (CYANOCOBALAMIN) 500 MCG tablet Take 1,000 mcg by mouth daily.    Historical Provider, MD   BP 129/89  Pulse 73  Temp(Src) 98 F (36.7 C)  Resp 18  Wt 189 lb 9 oz (85.985 kg)  SpO2 100% Physical Exam  Nursing note and vitals reviewed. Constitutional: She appears well-developed and well-nourished.  HENT:  Head: Normocephalic and atraumatic.  Right Ear: Tympanic membrane, external ear and ear canal normal.  Left Ear: Tympanic membrane, external ear and ear canal normal.  Nose: Nose normal.  Mouth/Throat: Uvula is midline, oropharynx is clear and moist and mucous membranes are normal. No trismus in the jaw. Abnormal dentition. Dental caries present. No dental abscesses or uvula swelling. No tonsillar abscesses.  Patient with L mandibular tooth pain and tenderness to palpation in area of third molar. Mild gum swelling/erythema  noted on exam.  Eyes: Conjunctivae are normal.  Neck: Normal range of motion. Neck supple.  No neck swelling or Ludwig's angina  Lymphadenopathy:    She has no cervical adenopathy.  Neurological: She is alert.  Skin: Skin is warm and dry.  Psychiatric: She has a normal mood and affect.    ED Course  Procedures (including critical care time) Labs Review Labs Reviewed - No data to display  Imaging Review No results found.   EKG  Interpretation None      12:17 AM Patient seen and examined. Work-up initiated. Medications ordered.   Vital signs reviewed and are as follows: Filed Vitals:   09/06/13 0013  BP: 129/89  Pulse: 73  Temp:   Resp:     Patient counseled to take prescribed medications as directed, return with worsening facial or neck swelling, and to follow-up with their dentist as soon as possible.   Patient counseled on use of narcotic pain medications. Counseled not to combine these medications with others containing tylenol. Urged not to drink alcohol, drive, or perform any other activities that requires focus while taking these medications. The patient verbalizes understanding and agrees with the plan.    MDM   Final diagnoses:  Pain, dental   Patient with toothache. No fever. Exam unconcerning for Ludwig's angina or other deep tissue infection in neck.   As there is gum swelling, will treat with antibiotic and pain medicine. Urged patient to follow-up with dentist.      Renne Crigler, PA-C 09/06/13 [phone]

## 2013-09-06 NOTE — Discharge Instructions (Signed)
Please read and follow all provided instructions.  Your diagnoses today include:  1. Pain, dental     The exam and treatment you received today has been provided on an emergency basis only. This is not a substitute for complete medical or dental care.  Tests performed today include:  Vital signs. See below for your results today.   Medications prescribed:   Penicillin - antibiotic  You have been prescribed an antibiotic medicine: take the entire course of medicine even if you are feeling better. Stopping early can cause the antibiotic not to work.   Vicodin (hydrocodone/acetaminophen) - narcotic pain medication  DO NOT drive or perform any activities that require you to be awake and alert because this medicine can make you drowsy. BE VERY CAREFUL not to take multiple medicines containing Tylenol (also called acetaminophen). Doing so can lead to an overdose which can damage your liver and cause liver failure and possibly death.   Naproxen - anti-inflammatory pain medication  Do not exceed 500mg  naproxen every 12 hours, take with food  You have been prescribed an anti-inflammatory medication or NSAID. Take with food. Take smallest effective dose for the shortest duration needed for your pain. Stop taking if you experience stomach pain or vomiting.   Take any prescribed medications only as directed.  Home care instructions:  Follow any educational materials contained in this packet.  Follow-up instructions: Please follow-up with your dentist for further evaluation of your symptoms. If you do not have a dentist or primary care doctor -- see below for referral information.   Dental Assistance: See below for dental referrals  Return instructions:   Please return to the Emergency Department if you experience worsening symptoms.  Please return if you develop a fever, you develop more swelling in your face or neck, you have trouble breathing or swallowing food.  Please return if  you have any other emergent concerns.  Additional Information:  Your vital signs today were: BP 135/90   Pulse 81   Temp(Src) 98 F (36.7 C)   Resp 18   Wt 189 lb 9 oz (85.985 kg)   SpO2 100% If your blood pressure (BP) was elevated above 135/85 this visit, please have this repeated by your doctor within one month. -------------- Dental Care: Organization         Address  Phone  Notes  [facility] Department of [facility] [address] [place], [place] [phone] Accepts children up to age 82 who are enrolled in [place] or [place] Health Choice; pregnant women with a Medicaid card; and children who have applied for Medicaid or [place] Health Choice, but were declined, whose parents can pay a reduced fee at time of service.  [facility] Department of [facility]  [address], [place] [phone] Accepts children up to age 16 who are enrolled in [place] or [place] Health Choice; pregnant women with a Medicaid card; and children who have applied for Medicaid or [place] Health Choice, but were declined, whose parents can pay a reduced fee at time of service.  [facility]  [address] [place], [place] [phone] Patients are seen by appointment only. Walk-ins are not accepted. [facility] will see patients 67 years of age and older. Monday - Tuesday (8am-5pm) Most Wednesdays (8:30-5pm) $30 per visit, cash only  [facility]  [address], [facility] [phone] Patients are seen by appointment only. Walk-ins  are not accepted. [facility] will see patients 46 years of age and older. One Wednesday Evening (Monthly: Volunteer Based).  $30 per visit, cash only  [facility]  [phone] for adults; Children under age 624, call Graduate Pediatric Dentistry at [phone]. Children aged 814-14, please call [phone] to request a pediatric application.   Dental services are provided in all areas of dental care including fillings, crowns and bridges, complete and partial dentures, implants, gum treatment, root canals, and extractions. Preventive care is also provided. Treatment is provided to both adults and children. Patients are selected via a lottery and there is often a waiting list.   [facility] [address], [place]  [phone] [email or web address]   [facility] [address], [place], [place] [phone]. [phone] Second and Fourth Thursday of each month, opens at 6:30 AM; Clinic ends at 9 AM.  Patients are seen on a first-come first-served basis, and a limited number are seen during each clinic.   [facility]  [address], [place], [place] [phone]   Eligibility Requirements You must have lived in [place], [place], or [place] counties for at least the last three months.   You cannot be eligible for state or federal sponsored [place] insurance, including [employer], [place], or [employer]icare.   You generally cannot be eligible for healthcare insurance through your employer.    How to apply: Eligibility screenings are held every Tuesday and Wednesday afternoon from 1:00 pm until 4:00 pm. You do not need an appointment for the interview!  [facility] [address], [place], [place] [phone]   [facility]  [phone]   [facility]  [phone]   [facility]  [phone]

## 2013-09-06 NOTE — ED Provider Notes (Signed)
Medical screening examination/treatment/procedure(s) were performed by non-physician practitioner and as supervising physician I was immediately available for consultation/collaboration.   EKG Interpretation None        Lyanne Co, MD 09/06/13 [phone]

## 2013-10-25 ENCOUNTER — Telehealth: Payer: Self-pay | Admitting: Family

## 2013-10-25 NOTE — Telephone Encounter (Signed)
Needs OV.  

## 2013-10-25 NOTE — Telephone Encounter (Signed)
Pt would like another round of abx for trich and metronidazole vaginal gel for vaginal bacteria inf. Pt has same partner and now having same symptoms as visit on 04-2013. [facility] [address]. Please call pt. Please advise

## 2013-10-25 NOTE — Telephone Encounter (Signed)
Pt has appt sch for 7/24

## 2013-10-27 ENCOUNTER — Encounter: Payer: Self-pay | Admitting: Family

## 2013-10-27 ENCOUNTER — Other Ambulatory Visit (HOSPITAL_COMMUNITY)
Admission: RE | Admit: 2013-10-27 | Discharge: 2013-10-27 | Disposition: A | Discharge status: Home / Self Care | Payer: BC Managed Care – PPO | Source: Ambulatory Visit | Attending: Family | Admitting: Family

## 2013-10-27 ENCOUNTER — Ambulatory Visit (INDEPENDENT_AMBULATORY_CARE_PROVIDER_SITE_OTHER): Payer: BC Managed Care – PPO | Admitting: Family

## 2013-10-27 VITALS — BP 120/80 | HR 64 | Temp 98.4°F | Wt 192.0 lb

## 2013-10-27 DIAGNOSIS — Z202 Contact with and (suspected) exposure to infections with a predominantly sexual mode of transmission: Secondary | ICD-10-CM

## 2013-10-27 DIAGNOSIS — N760 Acute vaginitis: Secondary | ICD-10-CM | POA: Insufficient documentation

## 2013-10-27 DIAGNOSIS — Z01419 Encounter for gynecological examination (general) (routine) without abnormal findings: Secondary | ICD-10-CM | POA: Insufficient documentation

## 2013-10-27 DIAGNOSIS — N898 Other specified noninflammatory disorders of vagina: Secondary | ICD-10-CM

## 2013-10-27 DIAGNOSIS — Z113 Encounter for screening for infections with a predominantly sexual mode of transmission: Secondary | ICD-10-CM | POA: Insufficient documentation

## 2013-10-27 DIAGNOSIS — Z124 Encounter for screening for malignant neoplasm of cervix: Secondary | ICD-10-CM

## 2013-10-27 MED ORDER — METRONIDAZOLE 500 MG PO TABS: 500.0000 mg | ORAL_TABLET | Freq: Two times a day (BID) | ORAL | Status: DC

## 2013-10-27 NOTE — Patient Instructions (Signed)
Trichomonas Test °The trichomonas test is done to diagnose trichomoniasis, an infection caused by an organism called Trichomonas. Trichomoniasis is a sexually transmitted infection (STI). In women, it causes vaginal infections. In men, it can cause the tube that carries urine (urethra) to become inflamed (urethritis). °You may have this test as a part of a routine screening for STIs or if you have symptoms of trichomoniasis. To perform the test, your health care provider will take a sample of discharge. The sample is taken from the vagina or cervix in women and from the urethra in men. A urine sample can also be used for testing. °RESULTS °It is your responsibility to obtain your test results. Ask the lab or department performing the test when and how you will get your results. Contact your health care provider to discuss any questions you have about your results.  °Meaning of Negative Test Results  °A negative test means you do not have trichomoniasis. Follow your health care provider's directions about any follow-up testing.  °Meaning of Positive Test Results  °A positive test result means you have an active infection that needs to be treated with antibiotic medicine. All your current sexual partners must also be treated or it is likely you will get reinfected.  °If your test is positive, your health care provider will start you on medicine and may advise you to: °· Not have sexual intercourse until your infection has cleared up. °· Use a latex condom properly every time you have sexual intercourse. °· Limit the number of sexual partners you have. The more partners you have, the greater your risk of contracting trichomoniasis or another STI. °· Tell all sexual partners about your infection so they can also be treated and to prevent reinfection. °Document Released: 04/25/2004 Document Revised: 08/07/2013 Document Reviewed: 04/04/2013 °ExitCare® Patient Information ©2015 ExitCare, LLC. This information is not  intended to replace advice given to you by your health care provider. Make sure you discuss any questions you have with your health care provider. ° °

## 2013-10-27 NOTE — Progress Notes (Signed)
   Subjective:    Patient ID: Angelica DarkJessica L Mondry, female    DOB: [birth year], 48 y.o.   MRN: [id number]  Exposure to STD    48 year old AA female presents with c/o malodorous vaginal discharge intermittent since being treated in January for trichomonas. She questions if her partner was ever treated.  LMP 10/27/13.   Review of Systems  Constitutional: Negative.   Respiratory: Negative.   Cardiovascular: Negative.   Genitourinary: Positive for vaginal bleeding and vaginal discharge.  Psychiatric/Behavioral: Negative.    History reviewed. No pertinent past medical history.  History   Social History  . Marital Status: Single    Spouse Name: N/A    Number of Children: N/A  . Years of Education: N/A   Occupational History  . Not on file.   Social History Main Topics  . Smoking status: Current Every Day Smoker  . Smokeless tobacco: Not on file  . Alcohol Use: Yes     Comment: wine occassionally  . Drug Use: No  . Sexual Activity: Yes    Partners: Male   Other Topics Concern  . Not on file   Social History Narrative  . No narrative on file    History reviewed. No pertinent past surgical history.  Family History  Problem Relation Age of Onset  . Alcohol abuse Mother   . Hypertension Mother   . Hypertension Father   . Diabetes Paternal Grandmother     Allergies  Allergen Reactions  . Rocephin [Ceftriaxone] Hives    Current Outpatient Prescriptions on File Prior to Visit  Medication Sig Dispense Refill  . HYDROcodone-acetaminophen (NORCO/VICODIN) 5-325 MG per tablet Take 1-2 tablets every 6 hours as needed for severe pain  10 tablet  0  . Multiple Vitamin (MULTIVITAMIN WITH MINERALS) TABS tablet Take 1 tablet by mouth daily.      . naproxen (NAPROSYN) 500 MG tablet Take 1 tablet (500 mg total) by mouth 2 (two) times daily.  20 tablet  0  . penicillin v potassium (VEETID) 500 MG tablet Take 1 tablet (500 mg total) by mouth 3 (three) times daily.  21 tablet  0  .  valACYclovir (VALTREX) 500 MG tablet Take 1 tablet (500 mg total) by mouth daily.  30 tablet  5  . vitamin B-12 (CYANOCOBALAMIN) 500 MCG tablet Take 1,000 mcg by mouth daily.       No current facility-administered medications on file prior to visit.    BP 120/80  Pulse 64  Temp(Src) 98.4 F (36.9 C) (Oral)  Wt 192 lb (87.091 kg)  LMP 10/26/2013     Objective:   Physical Exam  Constitutional: She is oriented to person, place, and time. She appears well-developed and well-nourished.  Cardiovascular: Normal rate, regular rhythm and normal heart sounds.   Pulmonary/Chest: Effort normal and breath sounds normal.  Genitourinary: Vagina normal.  Red VB from cervix consistent with menses.   Neurological: She is alert and oriented to person, place, and time.  Skin: Skin is warm and dry.  Psychiatric: She has a normal mood and affect.      Assessment & Plan:  Shanda BumpsJessica was seen today for exposure to std.  Diagnoses and associated orders for this visit:  Exposure to STD  Screening for malignant neoplasm of the cervix  Vaginal discharge  Other Orders - metroNIDAZOLE (FLAGYL) 500 MG tablet; Take 1 tablet (500 mg total) by mouth 2 (two) times daily.    Will notify patient pending results

## 2013-10-27 NOTE — Progress Notes (Signed)
Pre visit review using our clinic review tool, if applicable. No additional management support is needed unless otherwise documented below in the visit note. 

## 2013-10-30 ENCOUNTER — Telehealth: Payer: Self-pay | Admitting: Family

## 2013-10-30 LAB — CYTOLOGY - PAP

## 2013-10-30 NOTE — Telephone Encounter (Signed)
Note printed and pt aware

## 2013-10-30 NOTE — Telephone Encounter (Signed)
Pt would like a work note for 10/27/13. Pt would like to pick up note today

## 2013-12-14 ENCOUNTER — Ambulatory Visit (INDEPENDENT_AMBULATORY_CARE_PROVIDER_SITE_OTHER): Payer: BC Managed Care – PPO | Admitting: Family

## 2013-12-14 ENCOUNTER — Encounter: Payer: Self-pay | Admitting: Family

## 2013-12-14 VITALS — BP 120/74 | HR 89 | Temp 98.2°F | Resp 20 | Ht 66.0 in | Wt 195.0 lb

## 2013-12-14 DIAGNOSIS — H9209 Otalgia, unspecified ear: Secondary | ICD-10-CM

## 2013-12-14 DIAGNOSIS — H6981 Other specified disorders of Eustachian tube, right ear: Secondary | ICD-10-CM

## 2013-12-14 DIAGNOSIS — H6990 Unspecified Eustachian tube disorder, unspecified ear: Secondary | ICD-10-CM

## 2013-12-14 DIAGNOSIS — H9201 Otalgia, right ear: Secondary | ICD-10-CM

## 2013-12-14 DIAGNOSIS — H6980 Other specified disorders of Eustachian tube, unspecified ear: Secondary | ICD-10-CM

## 2013-12-14 MED ORDER — METHYLPREDNISOLONE 4 MG PO KIT: PACK | ORAL | Status: AC

## 2013-12-14 NOTE — Patient Instructions (Signed)
Barotitis Media Barotitis media is inflammation of your middle ear. This occurs when the auditory tube (eustachian tube) leading from the back of your nose (nasopharynx) to your eardrum is blocked. This blockage may result from a cold, environmental allergies, or an upper respiratory infection. Unresolved barotitis media may lead to damage or hearing loss (barotrauma), which may become permanent. HOME CARE INSTRUCTIONS   Use medicines as recommended by your health care provider. Over-the-counter medicines will help unblock the canal and can help during times of air travel.  Do not put anything into your ears to clean or unplug them. Eardrops will not be helpful.  Do not swim, dive, or fly until your health care provider says it is all right to do so. If these activities are necessary, chewing gum with frequent, forceful swallowing may help. It is also helpful to hold your nose and gently blow to pop your ears for equalizing pressure changes. This forces air into the eustachian tube.  Only take over-the-counter or prescription medicines for pain, discomfort, or fever as directed by your health care provider.  A decongestant may be helpful in decongesting the middle ear and make pressure equalization easier. SEEK MEDICAL CARE IF:  You experience a serious form of dizziness in which you feel as if the room is spinning and you feel nauseated (vertigo).  Your symptoms only involve one ear. SEEK IMMEDIATE MEDICAL CARE IF:   You develop a severe headache, dizziness, or severe ear pain.  You have bloody or pus-like drainage from your ears.  You develop a fever.  Your problems do not improve or become worse. MAKE SURE YOU:   Understand these instructions.  Will watch your condition.  Will get help right away if you are not doing well or get worse. Document Released: 03/20/2000 Document Revised: 01/11/2013 Document Reviewed: 10/18/2012 ExitCare Patient Information 2015 ExitCare, LLC. This  information is not intended to replace advice given to you by your health care provider. Make sure you discuss any questions you have with your health care provider.  

## 2013-12-14 NOTE — Progress Notes (Signed)
Pre visit review using our clinic review tool, if applicable. No additional management support is needed unless otherwise documented below in the visit note. 

## 2013-12-14 NOTE — Progress Notes (Signed)
   Subjective:    Patient ID: Angelica Yoder, female    DOB: [birth year], 48 y.o.   MRN: [id number]  HPI  48 year old [place] American female, smoker in today with complaints of right ear pain that began last night. Reports wheezing, cough, congestion for the last 2-3 days. Sinus drainage that is clear. Reports taking ibuprofen 800 mg that helped her pain. Denies any fever, chills or fatigue.  Review of Systems  Constitutional: Negative.   Respiratory: Negative.   Cardiovascular: Negative.   Gastrointestinal: Negative.   Endocrine: Negative.   Genitourinary: Negative.   Musculoskeletal: Negative.   Skin: Negative.   Allergic/Immunologic: Negative.   Hematological: Negative.   Psychiatric/Behavioral: Negative.        Objective:   Physical Exam  Constitutional: She is oriented to person, place, and time. She appears well-developed and well-nourished.  HENT:  Right Ear: External ear normal.  Left Ear: External ear normal.  Nose: Nose normal.  Mouth/Throat: Oropharynx is clear and moist.  Neck: Normal range of motion. Neck supple.  Cardiovascular: Normal rate, regular rhythm and normal heart sounds.   Pulmonary/Chest: Effort normal and breath sounds normal.  Musculoskeletal: Normal range of motion.  Neurological: She is alert and oriented to person, place, and time.  Skin: Skin is warm and dry.  Psychiatric: She has a normal mood and affect.          Assessment & Plan:  [name] was seen today for otalgia.  Diagnoses and associated orders for this visit:  Right ear pain  Eustachian tube dysfunction, right  Other Orders - methylPREDNISolone (MEDROL DOSEPAK) 4 MG tablet; follow package directions   Call the office with any questions or concerns. Recheck as scheduled or sooner as needed.

## 2014-01-13 ENCOUNTER — Encounter (HOSPITAL_COMMUNITY): Payer: Self-pay | Admitting: Emergency Medicine

## 2014-01-13 ENCOUNTER — Emergency Department (HOSPITAL_COMMUNITY)
Admission: EM | Admit: 2014-01-13 | Discharge: 2014-01-13 | Disposition: A | Discharge status: Home / Self Care | Payer: BC Managed Care – PPO | Attending: Emergency Medicine | Admitting: Emergency Medicine

## 2014-01-13 DIAGNOSIS — M62838 Other muscle spasm: Secondary | ICD-10-CM | POA: Diagnosis not present

## 2014-01-13 DIAGNOSIS — M542 Cervicalgia: Secondary | ICD-10-CM | POA: Insufficient documentation

## 2014-01-13 DIAGNOSIS — Z79899 Other long term (current) drug therapy: Secondary | ICD-10-CM | POA: Insufficient documentation

## 2014-01-13 DIAGNOSIS — Z720 Tobacco use: Secondary | ICD-10-CM | POA: Diagnosis not present

## 2014-01-13 DIAGNOSIS — M436 Torticollis: Secondary | ICD-10-CM | POA: Diagnosis present

## 2014-01-13 MED ORDER — KETOROLAC TROMETHAMINE 60 MG/2ML IM SOLN
60.0000 mg | Freq: Once | INTRAMUSCULAR | Status: AC
  Administered 2014-01-13: 60 mg via INTRAMUSCULAR
  Filled 2014-01-13: qty 2

## 2014-01-13 MED ORDER — TRAMADOL HCL 50 MG PO TABS: 50.0000 mg | ORAL_TABLET | Freq: Four times a day (QID) | ORAL | Status: AC | PRN

## 2014-01-13 MED ORDER — DIAZEPAM 5 MG PO TABS: 5.0000 mg | ORAL_TABLET | Freq: Two times a day (BID) | ORAL | Status: AC

## 2014-01-13 MED ORDER — DIAZEPAM 5 MG PO TABS
5.0000 mg | ORAL_TABLET | Freq: Once | ORAL | Status: AC
Start: 2014-01-13 — End: 2014-01-13
  Administered 2014-01-13: 5 mg via ORAL
  Filled 2014-01-13: qty 1

## 2014-01-13 NOTE — ED Provider Notes (Signed)
CSN: [id number]     Arrival date & time 01/13/14  [id number] History  This chart was scribed for Angelica Matasiffany G Geisha Abernathy, PA, working with Vanetta MuldersScott Zackowski, MD found by Elon SpannerGarrett Cook, ED Scribe. This patient was seen in room TR05C/TR05C and the patient's care was started at 10:15 AM.   Chief Complaint  Patient presents with  . Torticollis   The history is provided by the patient. No language interpreter was used.   HPI Comments: Angelica Yoder is a 48 y.o. female who presents to the Emergency Department complaining of current left-sided neck pain with associated, resolved left hand tingling onset last night when the patient woke up.  She reports radiation from the left trapezius to her left-sided neck that she rates a 10/10.  Patient states she is unable to turn her head to the left or look down all the way.  Patient denies taking any medications at home for his complaint.  Patient denies having a history of similar episodes.   Patient denies injury.  Patient denies headache, nausea, vomiting, fever, extremity numbness.  History reviewed. No pertinent past medical history. History reviewed. No pertinent past surgical history. Family History  Problem Relation Age of Onset  . Alcohol abuse Mother   . Hypertension Mother   . Hypertension Father   . Diabetes Paternal Grandmother    History  Substance Use Topics  . Smoking status: Current Every Day Smoker  . Smokeless tobacco: Not on file  . Alcohol Use: Yes     Comment: wine occassionally   OB History   Grav Para Term Preterm Abortions TAB SAB Ect Mult Living                 Review of Systems  Musculoskeletal: Positive for back pain and neck pain.  All other systems reviewed and are negative.    Allergies  Rocephin  Home Medications   Prior to Admission medications   Medication Sig Start Date End Date Taking? Authorizing Provider  guaiFENesin (MUCINEX) 600 MG 12 hr tablet Take 600 mg by mouth once.   Yes Historical Provider, MD   Multiple Vitamin (MULTIVITAMIN WITH MINERALS) TABS tablet Take 1 tablet by mouth daily.   Yes Historical Provider, MD  vitamin B-12 (CYANOCOBALAMIN) 500 MCG tablet Take 1,000 mcg by mouth daily.   Yes Historical Provider, MD  diazepam (VALIUM) 5 MG tablet Take 1 tablet (5 mg total) by mouth 2 (two) times daily. 01/13/14   Angelica Matasiffany G [name], PA-C  traMADol (ULTRAM) 50 MG tablet Take 1 tablet (50 mg total) by mouth every 6 (six) hours as needed. 01/13/14   Angelica Toves Irine SealG Evian Derringer, PA-C   BP 123/72  Pulse 84  Temp(Src) 97.8 F (36.6 C) (Oral)  Resp 16  Ht 5\' 6"  (1.676 m)  SpO2 100%  LMP 12/09/2013 Physical Exam  Nursing note and vitals reviewed. Constitutional: She is oriented to person, place, and time. She appears well-developed and well-nourished. No distress.  HENT:  Head: Normocephalic and atraumatic.  Eyes: Conjunctivae and EOM are normal.  Neck: Neck supple. No tracheal deviation present.  Cardiovascular: Normal rate.   Pulmonary/Chest: Effort normal. No respiratory distress.  Musculoskeletal: Normal range of motion.       Left shoulder: She exhibits tenderness, pain and spasm. She exhibits normal range of motion, no bony tenderness, no swelling, no effusion, no crepitus, no deformity, no laceration, normal pulse and normal strength.       Arms: Tenderness to palpation of the left trapezius muscle. No  pain to midline cervical spine. Normal ROM of neck and left shoulder  Neurological: She is alert and oriented to person, place, and time.  Skin: Skin is warm and dry.  Psychiatric: She has a normal mood and affect. Her behavior is normal.    ED Course  Procedures (including critical care time)  DIAGNOSTIC STUDIES: Oxygen Saturation is 100% on RA, normal by my interpretation.    COORDINATION OF CARE:  10:50 AM Patient reports her neck pain has improved since taking the muscle relaxer.  Will prescribe muscle relaxer and anti-inflammatory for at-home use.  Advised patient to not take  any other pain medications.  Advised patient to alternate between heat/ice.  Will provide work note.  Labs Review Labs Reviewed - No data to display  Imaging Review No results found.   EKG Interpretation None      MDM   Final diagnoses:  Trapezius muscle spasm   Medications  ketorolac (TORADOL) injection 60 mg (60 mg Intramuscular Given 01/13/14 0953)  diazepam (VALIUM) tablet 5 mg (5 mg Oral Given 01/13/14 0954)    Patients pain went from a 7/10 down to a 2/10 with pain control in the ED. She is feeling significantly better and requests note for work. Ortho referral given.   48 y.o.Angelica Yoder's evaluation in the Emergency Department is complete. It has been determined that no acute conditions requiring further emergency intervention are present at this time. The patient/guardian have been advised of the diagnosis and plan. We have discussed signs and symptoms that warrant return to the ED, such as changes or worsening in symptoms.  Vital signs are stable at discharge. Filed Vitals:   01/13/14 0921  BP: 123/72  Pulse: 84  Temp: 97.8 F (36.6 C)  Resp: 16    Patient/guardian has voiced understanding and agreed to follow-up with the PCP or specialist.   I personally performed the services described in this documentation, which was scribed in my presence. The recorded information has been reviewed and is accurate.    Angelica Matasiffany G Dynastie Knoop, PA-C 01/13/14 1051

## 2014-01-13 NOTE — Discharge Instructions (Signed)
Muscle Cramps and Spasms °Muscle cramps and spasms occur when a muscle or muscles tighten and you have no control over this tightening (involuntary muscle contraction). They are a common problem and can develop in any muscle. The most common place is in the calf muscles of the leg. Both muscle cramps and muscle spasms are involuntary muscle contractions, but they also have differences:  °· Muscle cramps are sporadic and painful. They may last a few seconds to a quarter of an hour. Muscle cramps are often more forceful and last longer than muscle spasms. °· Muscle spasms may or may not be painful. They may also last just a few seconds or much longer. °CAUSES  °It is uncommon for cramps or spasms to be due to a serious underlying problem. In many cases, the cause of cramps or spasms is unknown. Some common causes are:  °· Overexertion.   °· Overuse from repetitive motions (doing the same thing over and over).   °· Remaining in a certain position for a long period of time.   °· Improper preparation, form, or technique while performing a sport or activity.   °· Dehydration.   °· Injury.   °· Side effects of some medicines.   °· Abnormally low levels of the salts and ions in your blood (electrolytes), especially potassium and calcium. This could happen if you are taking water pills (diuretics) or you are pregnant.   °Some underlying medical problems can make it more likely to develop cramps or spasms. These include, but are not limited to:  °· Diabetes.   °· Parkinson disease.   °· Hormone disorders, such as thyroid problems.   °· Alcohol abuse.   °· Diseases specific to muscles, joints, and bones.   °· Blood vessel disease where not enough blood is getting to the muscles.   °HOME CARE INSTRUCTIONS  °· Stay well hydrated. Drink enough water and fluids to keep your urine clear or pale yellow. °· It may be helpful to massage, stretch, and relax the affected muscle. °· For tight or tense muscles, use a warm towel, heating  pad, or hot shower water directed to the affected area. °· If you are sore or have pain after a cramp or spasm, applying ice to the affected area may relieve discomfort. °¨ Put ice in a plastic bag. °¨ Place a towel between your skin and the bag. °¨ Leave the ice on for 15-20 minutes, 03-04 times a day. °· Medicines used to treat a known cause of cramps or spasms may help reduce their frequency or severity. Only take over-the-counter or prescription medicines as directed by your caregiver. °SEEK MEDICAL CARE IF:  °Your cramps or spasms get more severe, more frequent, or do not improve over time.  °MAKE SURE YOU:  °· Understand these instructions. °· Will watch your condition. °· Will get help right away if you are not doing well or get worse. °Document Released: 09/12/2001 Document Revised: 07/18/2012 Document Reviewed: 03/09/2012 °[employer]® Patient Information ©2015 [employer]. This information is not intended to replace advice given to you by your health care provider. Make sure you discuss any questions you have with your health care provider. °Torticollis, Acute °You have suddenly (acutely) developed a twisted neck (torticollis). This is usually a self-limited condition. °CAUSES  °Acute torticollis may be caused by malposition, trauma or infection. Most commonly, acute torticollis is caused by sleeping in an awkward position. Torticollis may also be caused by the flexion, extension or twisting of the neck muscles beyond their normal position. Sometimes, the exact cause may not be known. °SYMPTOMS  °  Usually, there is pain and limited movement of the neck. Your neck may twist to one side. °DIAGNOSIS  °The diagnosis is often made by physical examination. X-rays, CT scans or MRIs may be done if there is a history of trauma or concern of infection. °TREATMENT  °For a common, stiff neck that develops during sleep, treatment is focused on relaxing the contracted neck muscle. Medications (including shots) may be used  to treat the problem. Most cases resolve in several days. Torticollis usually responds to conservative physical therapy. If left untreated, the shortened and spastic neck muscle can cause deformities in the face and neck. Rarely, surgery is required. °HOME CARE INSTRUCTIONS  °· Use over-the-counter and prescription medications as directed by your caregiver. °· Do stretching exercises and massage the neck as directed by your caregiver. °· Follow up with physical therapy if needed and as directed by your caregiver. °SEEK IMMEDIATE MEDICAL CARE IF:  °· You develop difficulty breathing or noisy breathing (stridor). °· You drool, develop trouble swallowing or have pain with swallowing. °· You develop numbness or weakness in the hands or feet. °· You have changes in speech or vision. °· You have problems with urination or bowel movements. °· You have difficulty walking. °· You have a fever. °· You have increased pain. °MAKE SURE YOU:  °· Understand these instructions. °· Will watch your condition. °· Will get help right away if you are not doing well or get worse. °Document Released: 03/20/2000 Document Revised: 06/15/2011 Document Reviewed: 05/01/2009 °[employer]® Patient Information ©2015 [employer]. This information is not intended to replace advice given to you by your health care provider. Make sure you discuss any questions you have with your health care provider. ° °

## 2014-01-13 NOTE — ED Notes (Signed)
Declined W/C at D/C and was escorted to lobby by RN. 

## 2014-01-13 NOTE — ED Notes (Signed)
Pt. Stated, I woke up during the night with neck pain and unable to move it. Sitting it doesn't hurt.  When I move its a 10

## 2014-01-13 NOTE — ED Provider Notes (Signed)
Medical screening examination/treatment/procedure(s) were performed by non-physician practitioner and as supervising physician I was immediately available for consultation/collaboration.   EKG Interpretation None        Neesa Knapik, MD 01/13/14 1648 

## 2014-01-15 ENCOUNTER — Telehealth: Payer: Self-pay | Admitting: Family

## 2014-01-15 ENCOUNTER — Other Ambulatory Visit: Payer: Self-pay | Admitting: Family

## 2014-01-15 DIAGNOSIS — M542 Cervicalgia: Secondary | ICD-10-CM

## 2014-01-15 NOTE — Telephone Encounter (Signed)
Please advise 

## 2014-01-15 NOTE — Telephone Encounter (Signed)
done

## 2014-01-15 NOTE — Telephone Encounter (Signed)
Pt called to say that she went to ER on Sat 06/13/13 for neck pain and was told by the ER that she needed to see an Orthopedic doctor . Pt called to ask for a referral .

## 2014-01-17 ENCOUNTER — Telehealth: Payer: Self-pay | Admitting: Family

## 2014-01-17 NOTE — Telephone Encounter (Signed)
Pt wanted her work note faxed to [employer] [phone].  The fax went through.
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
## 2016-12-01 ENCOUNTER — Ambulatory Visit: Payer: Self-pay | Admitting: Internal Medicine

## 2018-06-12 ENCOUNTER — Emergency Department (HOSPITAL_COMMUNITY)
Admission: EM | Admit: 2018-06-12 | Discharge: 2018-06-12 | Disposition: A | Discharge status: Home / Self Care | Payer: Self-pay | Attending: Emergency Medicine | Admitting: Emergency Medicine

## 2018-06-12 ENCOUNTER — Other Ambulatory Visit: Payer: Self-pay

## 2018-06-12 ENCOUNTER — Encounter (HOSPITAL_COMMUNITY): Payer: Self-pay | Admitting: *Deleted

## 2018-06-12 DIAGNOSIS — Z79899 Other long term (current) drug therapy: Secondary | ICD-10-CM | POA: Insufficient documentation

## 2018-06-12 DIAGNOSIS — F17210 Nicotine dependence, cigarettes, uncomplicated: Secondary | ICD-10-CM | POA: Insufficient documentation

## 2018-06-12 DIAGNOSIS — K0889 Other specified disorders of teeth and supporting structures: Secondary | ICD-10-CM | POA: Insufficient documentation

## 2018-06-12 DIAGNOSIS — Z23 Encounter for immunization: Secondary | ICD-10-CM | POA: Insufficient documentation

## 2018-06-12 MED ORDER — OXYCODONE-ACETAMINOPHEN 5-325 MG PO TABS
1.0000 | ORAL_TABLET | Freq: Once | ORAL | Status: AC
  Administered 2018-06-12: 1 via ORAL
  Filled 2018-06-12: qty 1

## 2018-06-12 MED ORDER — TETANUS-DIPHTH-ACELL PERTUSSIS 5-2.5-18.5 LF-MCG/0.5 IM SUSP
0.5000 mL | Freq: Once | INTRAMUSCULAR | Status: AC
Start: 2018-06-12 — End: 2018-06-12
  Administered 2018-06-12: 0.5 mL via INTRAMUSCULAR
  Filled 2018-06-12: qty 0.5

## 2018-06-12 MED ORDER — AMOXICILLIN 500 MG PO CAPS: 500.0000 mg | ORAL_CAPSULE | Freq: Three times a day (TID) | ORAL | 0 refills | Status: AC

## 2018-06-12 MED ORDER — AMOXICILLIN 500 MG PO CAPS
500.0000 mg | ORAL_CAPSULE | Freq: Once | ORAL | Status: AC
  Administered 2018-06-12: 500 mg via ORAL
  Filled 2018-06-12: qty 1

## 2018-06-12 MED ORDER — IBUPROFEN 200 MG PO TABS
600.0000 mg | ORAL_TABLET | Freq: Once | ORAL | Status: AC
  Administered 2018-06-12: 600 mg via ORAL
  Filled 2018-06-12: qty 3

## 2018-06-12 NOTE — Discharge Instructions (Addendum)
Please take Ibuprofen (Advil, motrin) and Tylenol (acetaminophen) to relieve your pain.  You may take up to 600 MG (3 pills) of normal strength ibuprofen every 8 hours as needed.  In between doses of ibuprofen you make take tylenol, up to 1,000 mg (two extra strength pills).  Do not take more than 3,000 mg tylenol in a 24 hour period.  Please check all medication labels as many medications such as pain and cold medications may contain tylenol.  Do not drink alcohol while taking these medications.  Do not take other NSAID'S while taking ibuprofen (such as aleve or naproxen).  Please take ibuprofen with food to decrease stomach upset.  You may have diarrhea from the antibiotics.  It is very important that you continue to take the antibiotics even if you get diarrhea unless a medical professional tells you that you may stop taking them.  If you stop too early the bacteria you are being treated for will become stronger and you may need different, more powerful antibiotics that have more side effects and worsening diarrhea.  Please stay well hydrated and consider probiotics as they may decrease the severity of your diarrhea.  Please be aware that if you take any hormonal contraception (birth control pills, nexplanon, the ring, etc) that your birth control will not work while you are taking antibiotics and you need to use back up protection as directed on the birth control medication information insert.   Today you received medications that may make you sleepy or impair your ability to make decisions.  For the next 24 hours please do not drive, operate heavy machinery, care for a small child with out another adult present, or perform any activities that may cause harm to you or someone else if you were to fall asleep or be impaired.   

## 2018-06-12 NOTE — ED Triage Notes (Signed)
Pt c/o toothache x 1 week.  Pt presents with decay to right upper rear teeth x 2.

## 2018-06-12 NOTE — ED Provider Notes (Signed)
[facility]-EMERGENCY DEPT Provider Note   CSN: [id number] Arrival date & time: 06/12/18  7588    History   Chief Complaint Chief Complaint  Patient presents with  . Dental Pain    HPI Angelica Yoder is a 53 y.o. female with no significant past medical history who presents today for evaluation of dental pain.  She reports that approximately 1 month ago she was eating something when she broke her right upper molars.  She says that approximately 2 weeks ago her pain got significantly worse.  She does not have a dentist.  She has been trying generic pain relievers at home, she thinks this is Tylenol base, without significant relief.  Last dose was over 8 hours ago.  She denies any pain with opening her jaw, no voice changes or difficulty swallowing.  Unsure when last Tdap was.   Other than her dental pain she reports that she is otherwise well, no fevers or other complaints.     HPI  History reviewed. No pertinent past medical history.  Patient Active Problem List   Diagnosis Date Noted  . Genital herpes 04/25/2013    History reviewed. No pertinent surgical history.   OB History   No obstetric history on file.      Home Medications    Prior to Admission medications   Medication Sig Start Date End Date Taking? Authorizing Provider  amoxicillin (AMOXIL) 500 MG capsule Take 1 capsule (500 mg total) by mouth 3 (three) times daily. 06/12/18   Cristina Gong, PA-C  diazepam (VALIUM) 5 MG tablet Take 1 tablet (5 mg total) by mouth 2 (two) times daily. 01/13/14   Marlon Pel, PA-C  guaiFENesin (MUCINEX) 600 MG 12 hr tablet Take 600 mg by mouth once.    [provider]  Multiple Vitamin (MULTIVITAMIN WITH MINERALS) TABS tablet Take 1 tablet by mouth daily.    [provider]  traMADol (ULTRAM) 50 MG tablet Take 1 tablet (50 mg total) by mouth every 6 (six) hours as needed. 01/13/14   Marlon Pel, PA-C  vitamin B-12  (CYANOCOBALAMIN) 500 MCG tablet Take 1,000 mcg by mouth daily.    [provider]    Family History Family History  Problem Relation Age of Onset  . Alcohol abuse Mother   . Hypertension Mother   . Hypertension Father   . Diabetes Paternal Grandmother     Social History Social History   Tobacco Use  . Smoking status: Current Every Day Smoker  Substance Use Topics  . Alcohol use: Yes    Comment: wine occassionally  . Drug use: No     Allergies   Rocephin [ceftriaxone]   Review of Systems Review of Systems  Constitutional: Negative for chills and fever.  HENT: Positive for dental problem. Negative for facial swelling, sore throat, trouble swallowing and voice change.   All other systems reviewed and are negative.    Physical Exam Updated Vital Signs BP (!) 153/93 (BP Location: Left Arm)   Pulse 77   Temp 98.1 F (36.7 C) (Oral)   Resp 18   Ht 5\' 6"  (1.676 m)   Wt 86.6 kg   LMP 06/09/2018 (Exact Date)   SpO2 98%   BMI 30.83 kg/m   Physical Exam Vitals signs and nursing note reviewed.  Constitutional:      General: She is not in acute distress.    Appearance: She is not ill-appearing.  HENT:     Head: Normocephalic.  Right Ear: External ear normal.     Left Ear: External ear normal.     Nose: Nose normal.     Mouth/Throat:     Mouth: Mucous membranes are moist.     Pharynx: Oropharynx is clear. Uvula midline. No pharyngeal swelling or posterior oropharyngeal erythema.     Tonsils: No tonsillar exudate or tonsillar abscesses.     Comments: Dentition is in generally poor state.  Teeth numbers 2 and 3 are both broken.  There is no intraoral swelling consistent with dental abscess.  Uvula is midline.  No elevation of the floor of the mouth. Neck:     Musculoskeletal: Normal range of motion. No neck rigidity or muscular tenderness.     Comments: No submandibular swelling. Cardiovascular:     Rate and Rhythm: Normal rate.  Pulmonary:      Effort: Pulmonary effort is normal. No respiratory distress.  Lymphadenopathy:     Cervical: No cervical adenopathy.  Neurological:     Mental Status: She is alert.      ED Treatments / Results  Labs (all labs ordered are listed, but only abnormal results are displayed) Labs Reviewed - No data to display  EKG None  Radiology No results found.  Procedures Procedures (including critical care time)  Medications Ordered in ED Medications  oxyCODONE-acetaminophen (PERCOCET/ROXICET) 5-325 MG per tablet 1 tablet (has no administration in time range)  amoxicillin (AMOXIL) capsule 500 mg (has no administration in time range)  ibuprofen (ADVIL,MOTRIN) tablet 600 mg (has no administration in time range)  Tdap (BOOSTRIX) injection 0.5 mL (has no administration in time range)     Initial Impression / Assessment and Plan / ED Course  I have reviewed the triage vital signs and the nursing notes.  Pertinent labs & imaging results that were available during my care of the patient were reviewed by me and considered in my medical decision making (see chart for details).  Clinical Course as of Jun 12 727  Wynelle Link Jun 12, 2018  0721 Patient has allergy listed to Rocephin, she says that that was when she was in her 39s.  She has had Amoxil as recently as last year according to her without difficulties or allergic reaction.   [EH]    Clinical Course User Index [EH] Cristina Gong, PA-C      Patient with toothache.  No gross abscess.  Exam unconcerning for Ludwig's angina or spread of infection.  Will treat with amoxicillin and pain medicine.  Patient has allergy listed to Rocephin, however states that within the past year she has had amoxicillin without allergic reaction or other complication and tolerated it well.  As she has multiple broken teeth and is unsure when her last tetanus shot was Tdap was updated.  Urged patient to follow-up with dentist.     Final Clinical Impressions(s) /  ED Diagnoses   Final diagnoses:  Pain, dental    ED Discharge Orders         Ordered    amoxicillin (AMOXIL) 500 MG capsule  3 times daily     06/12/18 0721           Cristina Gong, PA-C 06/12/18 5364    Azalia Bilis, MD 06/12/18 [phone]
